# Patient Record
Sex: Male | Born: 2000 | Race: Black or African American | Hispanic: No | Marital: Single | Smoking: Never smoker
Health system: Southern US, Community
[De-identification: ages and names within clinical notes are randomized; demographics above are authoritative.]

---
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
## 2018-10-05 DIAGNOSIS — L700 Acne vulgaris: Secondary | ICD-10-CM | POA: Diagnosis not present

## 2018-10-05 DIAGNOSIS — D485 Neoplasm of uncertain behavior of skin: Secondary | ICD-10-CM | POA: Diagnosis not present

## 2018-10-10 DIAGNOSIS — R51 Headache: Secondary | ICD-10-CM | POA: Diagnosis not present

## 2019-02-16 DIAGNOSIS — L700 Acne vulgaris: Secondary | ICD-10-CM | POA: Diagnosis not present

## 2019-02-16 DIAGNOSIS — Z1331 Encounter for screening for depression: Secondary | ICD-10-CM | POA: Diagnosis not present

## 2019-04-15 DIAGNOSIS — M61152 Myositis ossificans progressiva, left thigh: Secondary | ICD-10-CM | POA: Diagnosis not present

## 2019-04-20 DIAGNOSIS — L700 Acne vulgaris: Secondary | ICD-10-CM | POA: Diagnosis not present

## 2019-04-21 DIAGNOSIS — M25662 Stiffness of left knee, not elsewhere classified: Secondary | ICD-10-CM | POA: Diagnosis not present

## 2019-04-21 DIAGNOSIS — S76112D Strain of left quadriceps muscle, fascia and tendon, subsequent encounter: Secondary | ICD-10-CM | POA: Diagnosis not present

## 2019-04-21 DIAGNOSIS — M6281 Muscle weakness (generalized): Secondary | ICD-10-CM | POA: Diagnosis not present

## 2019-04-23 DIAGNOSIS — M6281 Muscle weakness (generalized): Secondary | ICD-10-CM | POA: Diagnosis not present

## 2019-04-23 DIAGNOSIS — S76112D Strain of left quadriceps muscle, fascia and tendon, subsequent encounter: Secondary | ICD-10-CM | POA: Diagnosis not present

## 2019-04-23 DIAGNOSIS — M25662 Stiffness of left knee, not elsewhere classified: Secondary | ICD-10-CM | POA: Diagnosis not present

## 2019-05-12 DIAGNOSIS — M61152 Myositis ossificans progressiva, left thigh: Secondary | ICD-10-CM | POA: Diagnosis not present

## 2019-08-13 ENCOUNTER — Other Ambulatory Visit: Payer: Self-pay

## 2019-08-13 DIAGNOSIS — Z20822 Contact with and (suspected) exposure to covid-19: Secondary | ICD-10-CM

## 2019-08-14 LAB — NOVEL CORONAVIRUS, NAA: SARS-CoV-2, NAA: NOT DETECTED

## 2019-08-17 DIAGNOSIS — J020 Streptococcal pharyngitis: Secondary | ICD-10-CM | POA: Diagnosis not present

## 2019-08-17 DIAGNOSIS — J029 Acute pharyngitis, unspecified: Secondary | ICD-10-CM | POA: Diagnosis not present

## 2019-09-14 DIAGNOSIS — U071 COVID-19: Secondary | ICD-10-CM | POA: Diagnosis not present

## 2019-09-16 ENCOUNTER — Other Ambulatory Visit: Payer: Self-pay

## 2019-09-16 ENCOUNTER — Encounter (HOSPITAL_COMMUNITY): Payer: Self-pay

## 2019-09-16 ENCOUNTER — Ambulatory Visit (HOSPITAL_COMMUNITY)
Admission: EM | Admit: 2019-09-16 | Discharge: 2019-09-16 | Disposition: A | Discharge status: Home / Self Care | Payer: BC Managed Care – PPO | Attending: Family Medicine | Admitting: Family Medicine

## 2019-09-16 DIAGNOSIS — Z880 Allergy status to penicillin: Secondary | ICD-10-CM | POA: Diagnosis not present

## 2019-09-16 DIAGNOSIS — R519 Headache, unspecified: Secondary | ICD-10-CM

## 2019-09-16 DIAGNOSIS — U071 COVID-19: Secondary | ICD-10-CM | POA: Insufficient documentation

## 2019-09-16 NOTE — ED Triage Notes (Signed)
Patient presents to Urgent Care with complaints of requesting a covid test since he had a headache 3 days ago. Patient reports he has no symptoms at this time, no known exposures.

## 2019-09-16 NOTE — Discharge Instructions (Addendum)
If your Covid-19 test is positive, you will receive a phone call from [facility] regarding your results. Negative test results are not called. Both positive and negative results area always visible on MyChart. If you do not have a MyChart account, sign up instructions are in your discharge papers.  

## 2019-09-18 LAB — NOVEL CORONAVIRUS, NAA (HOSP ORDER, SEND-OUT TO REF LAB; TAT 18-24 HRS): SARS-CoV-2, NAA: DETECTED — AB

## 2019-09-20 ENCOUNTER — Telehealth: Payer: Self-pay | Admitting: Emergency Medicine

## 2019-09-20 NOTE — Telephone Encounter (Signed)

## 2019-09-21 ENCOUNTER — Telehealth (HOSPITAL_COMMUNITY): Payer: Self-pay | Admitting: Emergency Medicine

## 2019-09-21 NOTE — Telephone Encounter (Signed)
Attempted to reach patient x2. No answer at this time. Voicemail left.   Letter sent.   

## 2019-09-21 NOTE — ED Provider Notes (Signed)
  [facility]   [id number] 09/16/19 Arrival Time: 4010  ASSESSMENT & PLAN:  1. Acute nonintractable headache, unspecified headache type     COVID-19 testing sent. To self-quarantine until results are available. Normal neurologically. OTC symptom care.   Reviewed expectations re: course of current medical issues. Questions answered. Outlined signs and symptoms indicating need for more acute intervention. Patient verbalized understanding. After Visit Summary given.   SUBJECTIVE: History from: patient. Damon Porter is a 18 y.o. male who requests COVID-19 testing. Known COVID-19 contact: none known. Recent travel: none. Denies: runny nose, congestion, fever, cough, sore throat and difficulty breathing. Does reports a generalized headache over the past few days. Afebrile. Normal PO intake without n/v/d.  ROS: As per HPI.   OBJECTIVE:  Vitals:   09/16/19 1806  BP: 129/72  Pulse: 74  Resp: 15  Temp: 98.7 F (37.1 C)  TempSrc: Oral  SpO2: 100%    General appearance: alert; no distress Eyes: PERRLA; EOMI; conjunctiva normal HENT: [place]; AT; nasal mucosa normal; oral mucosa normal Neck: supple  Lungs: speaks full sentences without difficulty; unlabored Heart: regular rate and rhythm Abdomen: soft, non-tender Extremities: no edema Skin: warm and dry Neurologic: normal gait; CN 2-12 grossly intact; moves all extremities normally Psychological: alert and cooperative; normal mood and affect    Allergies  Allergen Reactions  . Penicillins Hives     Social History   Socioeconomic History  . Marital status: Single    Spouse name: Not on file  . Number of children: Not on file  . Years of education: Not on file  . Highest education level: Not on file  Occupational History  . Not on file  Tobacco Use  . Smoking status: Never Smoker  . Smokeless tobacco: Never Used  Substance and Sexual Activity  . Alcohol use: Not Currently  . Drug use: Not Currently    . Sexual activity: Not on file  Other Topics Concern  . Not on file  Social History Narrative  . Not on file   Social Determinants of Health   Financial Resource Strain:   . Difficulty of Paying Living Expenses: Not on file  Food Insecurity:   . Worried About Charity fundraiser in the Last Year: Not on file  . Ran Out of Food in the Last Year: Not on file  Transportation Needs:   . Lack of Transportation (Medical): Not on file  . Lack of Transportation (Non-Medical): Not on file  Physical Activity:   . Days of Exercise per Week: Not on file  . Minutes of Exercise per Session: Not on file  Stress:   . Feeling of Stress : Not on file  Social Connections:   . Frequency of Communication with Friends and Family: Not on file  . Frequency of Social Gatherings with Friends and Family: Not on file  . Attends Religious Services: Not on file  . Active Member of Clubs or Organizations: Not on file  . Attends Archivist Meetings: Not on file  . Marital Status: Not on file  Intimate Partner Violence:   . Fear of Current or Ex-Partner: Not on file  . Emotionally Abused: Not on file  . Physically Abused: Not on file  . Sexually Abused: Not on file   Family History  Problem Relation Age of Onset  . Healthy Mother   . Healthy Father    History reviewed. No pertinent surgical history.   Vanessa Kick, MD 09/21/19 [phone]

## 2019-09-24 ENCOUNTER — Ambulatory Visit: Payer: BC Managed Care – PPO | Attending: Internal Medicine

## 2019-09-24 DIAGNOSIS — Z20822 Contact with and (suspected) exposure to covid-19: Secondary | ICD-10-CM

## 2019-09-24 DIAGNOSIS — Z20828 Contact with and (suspected) exposure to other viral communicable diseases: Secondary | ICD-10-CM | POA: Diagnosis not present

## 2019-09-25 LAB — NOVEL CORONAVIRUS, NAA: SARS-CoV-2, NAA: NOT DETECTED

## 2019-09-28 ENCOUNTER — Other Ambulatory Visit: Payer: BC Managed Care – PPO

## 2019-10-19 DIAGNOSIS — L700 Acne vulgaris: Secondary | ICD-10-CM | POA: Diagnosis not present

## 2019-10-22 DIAGNOSIS — Z79899 Other long term (current) drug therapy: Secondary | ICD-10-CM | POA: Diagnosis not present

## 2019-10-22 DIAGNOSIS — L700 Acne vulgaris: Secondary | ICD-10-CM | POA: Diagnosis not present

## 2019-12-21 DIAGNOSIS — L700 Acne vulgaris: Secondary | ICD-10-CM | POA: Diagnosis not present

## 2020-01-19 DIAGNOSIS — Z20828 Contact with and (suspected) exposure to other viral communicable diseases: Secondary | ICD-10-CM | POA: Diagnosis not present

## 2020-07-19 ENCOUNTER — Encounter (HOSPITAL_BASED_OUTPATIENT_CLINIC_OR_DEPARTMENT_OTHER): Payer: Self-pay | Admitting: *Deleted

## 2020-07-19 ENCOUNTER — Emergency Department (HOSPITAL_BASED_OUTPATIENT_CLINIC_OR_DEPARTMENT_OTHER)
Admission: EM | Admit: 2020-07-19 | Discharge: 2020-07-19 | Disposition: A | Discharge status: Home / Self Care | Payer: BC Managed Care – PPO | Attending: Emergency Medicine | Admitting: Emergency Medicine

## 2020-07-19 ENCOUNTER — Other Ambulatory Visit: Payer: Self-pay

## 2020-07-19 DIAGNOSIS — B349 Viral infection, unspecified: Secondary | ICD-10-CM | POA: Insufficient documentation

## 2020-07-19 DIAGNOSIS — R509 Fever, unspecified: Secondary | ICD-10-CM | POA: Diagnosis not present

## 2020-07-19 DIAGNOSIS — R000 Tachycardia, unspecified: Secondary | ICD-10-CM | POA: Diagnosis not present

## 2020-07-19 DIAGNOSIS — Z20822 Contact with and (suspected) exposure to covid-19: Secondary | ICD-10-CM | POA: Diagnosis not present

## 2020-07-19 LAB — GROUP A STREP BY PCR: Group A Strep by PCR: NOT DETECTED

## 2020-07-19 LAB — URINALYSIS, ROUTINE W REFLEX MICROSCOPIC
Bilirubin Urine: NEGATIVE
Glucose, UA: NEGATIVE mg/dL
Hgb urine dipstick: NEGATIVE
Ketones, ur: NEGATIVE mg/dL
Leukocytes,Ua: NEGATIVE
Nitrite: NEGATIVE
Protein, ur: NEGATIVE mg/dL
Specific Gravity, Urine: 1.015 (ref 1.005–1.030)
pH: 8 (ref 5.0–8.0)

## 2020-07-19 LAB — RESPIRATORY PANEL BY RT PCR (FLU A&B, COVID)
Influenza A by PCR: NEGATIVE
Influenza B by PCR: NEGATIVE
SARS Coronavirus 2 by RT PCR: NEGATIVE

## 2020-07-19 MED ORDER — IBUPROFEN 800 MG PO TABS
ORAL_TABLET | ORAL | Status: AC
  Administered 2020-07-19: 800 mg via ORAL
  Filled 2020-07-19: qty 1

## 2020-07-19 MED ORDER — IBUPROFEN 800 MG PO TABS: 800.0000 mg | ORAL_TABLET | Freq: Once | ORAL | Status: AC

## 2020-07-19 MED ORDER — ONDANSETRON 4 MG PO TBDP
4.0000 mg | ORAL_TABLET | Freq: Once | ORAL | Status: AC
  Administered 2020-07-19: 4 mg via ORAL
  Filled 2020-07-19: qty 1

## 2020-07-19 MED ORDER — ACETAMINOPHEN 325 MG PO TABS
650.0000 mg | ORAL_TABLET | Freq: Once | ORAL | Status: AC
  Administered 2020-07-19: 650 mg via ORAL
  Filled 2020-07-19: qty 2

## 2020-07-19 NOTE — ED Triage Notes (Signed)
C/o fever chills body aches , n/v/d x 1 day

## 2020-07-19 NOTE — Discharge Instructions (Signed)
Continue to drink plenty of fluids and take Tylenol for the fever.  Make sure you are getting plenty of rest and you are not to return to work until you are fever free for 48 hours.

## 2020-07-19 NOTE — ED Notes (Signed)
  Patient ambulated around the nurses station with no complications.  Patient states he had no chest pain or SOB.  Only complaints of pain are a headache 4/10.  SPO2 99% during ambulation.

## 2020-07-19 NOTE — ED Provider Notes (Signed)
[facility] EMERGENCY DEPARTMENT Provider Note   CSN: [id number] Arrival date & time: 07/19/20  1740     History Chief Complaint  Patient presents with  . covid symptoms    Damon Porter is a 19 y.o. male.  Patient is a 19 year old healthy male with no known medical problems presenting today with 12 hours of fever, nausea, diarrhea, sore throat, body aches and headache.  This started this morning at around 4 AM.  Patient reports that he was working at his job all day and finally was feeling so bad he he could not continue working.  He went and got a rapid Covid test but it was negative.  Patient has had no known Covid exposures but also has not received the vaccine.  Patient reports that he is also been having flank pain and some dysuria today as well.  He has not had any penile discharge.  He is sexually active but reports he use a condom every time his only had one partner for months.  He has not taken any medication and reports because of the nausea and diarrhea he has not had anything to eat today.  The history is provided by the patient.       History reviewed. No pertinent past medical history.  There are no problems to display for this patient.   History reviewed. No pertinent surgical history.     Family History  Problem Relation Age of Onset  . Healthy Mother   . Healthy Father     Social History   Tobacco Use  . Smoking status: Never Smoker  . Smokeless tobacco: Never Used  Vaping Use  . Vaping Use: Never used  Substance Use Topics  . Alcohol use: Not Currently  . Drug use: Not Currently    Home Medications Prior to Admission medications   Not on File    Allergies    Penicillins  Review of Systems   Review of Systems  All other systems reviewed and are negative.   Physical Exam Updated Vital Signs BP (!) 94/56   Pulse (!) 123   Temp (!) 103.4 F (39.7 C)   Resp 18   Ht 6\' 4"  (1.93 m)   Wt 68 kg   SpO2 100%   BMI 18.26  kg/m   Physical Exam Vitals and nursing note reviewed.  Constitutional:      General: He is not in acute distress.    Appearance: Normal appearance. He is well-developed and normal weight.  HENT:     Head: Normocephalic and atraumatic.     Right Ear: Tympanic membrane normal.     Left Ear: Tympanic membrane normal.     Mouth/Throat:     Mouth: Mucous membranes are dry.     Comments: Mild erythema of bilateral tonsils but no exudate present Eyes:     Conjunctiva/sclera: Conjunctivae normal.     Pupils: Pupils are equal, round, and reactive to light.  Neck:     Comments: No cervical adenopathy Cardiovascular:     Rate and Rhythm: Regular rhythm. Tachycardia present.     Pulses: Normal pulses.     Heart sounds: No murmur heard.   Pulmonary:     Effort: Pulmonary effort is normal. No respiratory distress.     Breath sounds: Normal breath sounds. No wheezing or rales.  Abdominal:     General: There is no distension.     Palpations: Abdomen is soft.     Tenderness: There is no  abdominal tenderness. There is no right CVA tenderness, left CVA tenderness, guarding or rebound.  Musculoskeletal:        General: No tenderness. Normal range of motion.     Cervical back: Normal range of motion and neck supple.     Right lower leg: No edema.     Left lower leg: No edema.  Lymphadenopathy:     Cervical: No cervical adenopathy.  Skin:    General: Skin is warm and dry.     Findings: No erythema or rash.  Neurological:     General: No focal deficit present.     Mental Status: He is alert and oriented to person, place, and time. Mental status is at baseline.  Psychiatric:        Mood and Affect: Mood normal.        Behavior: Behavior normal.        Thought Content: Thought content normal.     ED Results / Procedures / Treatments   Labs (all labs ordered are listed, but only abnormal results are displayed) Labs Reviewed  RESPIRATORY PANEL BY RT PCR (FLU A&B, COVID)  GROUP A STREP  BY PCR  URINALYSIS, ROUTINE W REFLEX MICROSCOPIC    EKG None  Radiology No results found.  Procedures Procedures (including critical care time)  Medications Ordered in ED Medications  ondansetron (ZOFRAN-ODT) disintegrating tablet 4 mg (has no administration in time range)  acetaminophen (TYLENOL) tablet 650 mg (650 mg Oral Given 07/19/20 1755)    ED Course  I have reviewed the triage vital signs and the nursing notes.  Pertinent labs & imaging results that were available during my care of the patient were reviewed by me and considered in my medical decision making (see chart for details).    MDM Rules/Calculators/A&P                          Pt with symptoms consistent with viral URI vs COVID.  Well appearing here.  No signs of breathing difficulty  No signs of pharyngitis, otitis or abnormal abdominal findings.  Mild bilateral erythema of tonsils and strep is pending.  Patient is complaining of dysuria will check a urine.  He was given Zofran and will attempt a p.o. challenge.  Patient was tachycardic, febrile and had a borderline blood pressure upon arrival here of 94/56.  Will recheck vitals after he has received Tylenol and drink some fluid.  He is otherwise well-appearing.  COVID test is pending.  6:55 PM COVID and flu are neg.  UA and strep are pending.  Pt has benign abd at this time.  9:40 PM All labs are wnl.  On repeat evaluation patient's temperature has improved and his blood pressure and heart rate are also improved.  He got up and walked around and denies any chest pain or shortness of breath.  Breath sounds are clear.  Suspect viral illness.  Patient's mom is present and discussed findings with her.  Patient is tolerating p.o.'s.  Will discharge home to quarantine, rest, fluids and antipyretics.  Patient given return precautions.  MDM Number of Diagnoses or Management Options   Amount and/or Complexity of Data Reviewed Clinical lab tests: ordered and  reviewed Independent visualization of images, tracings, or specimens: yes  Risk of Complications, Morbidity, and/or Mortality Presenting problems: moderate Diagnostic procedures: minimal Management options: minimal  Patient Progress Patient progress: improved    Final Clinical Impression(s) / ED Diagnoses Final diagnoses:  Acute viral  syndrome    Rx / DC Orders ED Discharge Orders    None       Gwyneth Sprout, MD 07/19/20 2141

## 2020-07-19 NOTE — ED Notes (Signed)
Pt tolerating po fluids. No emesis. Fever decreased. Pt states they feel better.

## 2020-09-14 DIAGNOSIS — Z23 Encounter for immunization: Secondary | ICD-10-CM | POA: Diagnosis not present

## 2021-04-05 DIAGNOSIS — Z20822 Contact with and (suspected) exposure to covid-19: Secondary | ICD-10-CM | POA: Diagnosis not present

## 2021-04-05 DIAGNOSIS — U071 COVID-19: Secondary | ICD-10-CM | POA: Diagnosis not present

## 2021-10-08 DIAGNOSIS — U071 COVID-19: Secondary | ICD-10-CM | POA: Diagnosis not present

## 2021-10-08 DIAGNOSIS — Z20828 Contact with and (suspected) exposure to other viral communicable diseases: Secondary | ICD-10-CM | POA: Diagnosis not present

## 2021-10-08 DIAGNOSIS — Z03818 Encounter for observation for suspected exposure to other biological agents ruled out: Secondary | ICD-10-CM | POA: Diagnosis not present

## 2021-10-09 DIAGNOSIS — J029 Acute pharyngitis, unspecified: Secondary | ICD-10-CM | POA: Diagnosis not present

## 2021-10-09 DIAGNOSIS — Z20822 Contact with and (suspected) exposure to covid-19: Secondary | ICD-10-CM | POA: Diagnosis not present

## 2021-10-09 DIAGNOSIS — J069 Acute upper respiratory infection, unspecified: Secondary | ICD-10-CM | POA: Diagnosis not present

## 2024-05-14 ENCOUNTER — Ambulatory Visit
Admission: EM | Admit: 2024-05-14 | Discharge: 2024-05-14 | Disposition: A | Discharge status: Home / Self Care | Attending: Family Medicine | Admitting: Family Medicine

## 2024-05-14 ENCOUNTER — Emergency Department (HOSPITAL_BASED_OUTPATIENT_CLINIC_OR_DEPARTMENT_OTHER)
Admission: EM | Admit: 2024-05-14 | Discharge: 2024-05-14 | Disposition: A | Discharge status: Home / Self Care | Attending: Emergency Medicine | Admitting: Emergency Medicine

## 2024-05-14 ENCOUNTER — Encounter (HOSPITAL_BASED_OUTPATIENT_CLINIC_OR_DEPARTMENT_OTHER): Payer: Self-pay | Admitting: Emergency Medicine

## 2024-05-14 ENCOUNTER — Other Ambulatory Visit: Payer: Self-pay

## 2024-05-14 DIAGNOSIS — D72829 Elevated white blood cell count, unspecified: Secondary | ICD-10-CM | POA: Diagnosis not present

## 2024-05-14 DIAGNOSIS — R112 Nausea with vomiting, unspecified: Secondary | ICD-10-CM

## 2024-05-14 DIAGNOSIS — K529 Noninfective gastroenteritis and colitis, unspecified: Secondary | ICD-10-CM

## 2024-05-14 LAB — COMPREHENSIVE METABOLIC PANEL WITH GFR
ALT: 36 U/L (ref 0–44)
AST: 43 U/L — ABNORMAL HIGH (ref 15–41)
Albumin: 5.1 g/dL — ABNORMAL HIGH (ref 3.5–5.0)
Alkaline Phosphatase: 106 U/L (ref 38–126)
Anion gap: 19 — ABNORMAL HIGH (ref 5–15)
BUN: 17 mg/dL (ref 6–20)
CO2: 23 mmol/L (ref 22–32)
Calcium: 10.2 mg/dL (ref 8.9–10.3)
Chloride: 100 mmol/L (ref 98–111)
Creatinine, Ser: 0.98 mg/dL (ref 0.61–1.24)
GFR, Estimated: >60 mL/min (ref >60)
Glucose, Bld: 82 mg/dL (ref 70–99)
Potassium: 5.1 mmol/L (ref 3.5–5.1)
Sodium: 141 mmol/L (ref 135–145)
Total Bilirubin: 1.5 mg/dL — ABNORMAL HIGH (ref 0.0–1.2)
Total Protein: 8.1 g/dL (ref 6.5–8.1)

## 2024-05-14 LAB — LIPASE, BLOOD: Lipase: 11 U/L (ref 11–51)

## 2024-05-14 LAB — CBC
HCT: 46.2 % (ref 39.0–52.0)
Hemoglobin: 15.5 g/dL (ref 13.0–17.0)
MCH: 31.1 pg (ref 26.0–34.0)
MCHC: 33.5 g/dL (ref 30.0–36.0)
MCV: 92.8 fL (ref 80.0–100.0)
Platelets: 293 K/uL (ref 150–400)
RBC: 4.98 MIL/uL (ref 4.22–5.81)
RDW: 12 % (ref 11.5–15.5)
WBC: 14.6 K/uL — ABNORMAL HIGH (ref 4.0–10.5)
nRBC: 0 % (ref 0.0–0.2)

## 2024-05-14 LAB — POC SOFIA SARS ANTIGEN FIA: SARS Coronavirus 2 Ag: NEGATIVE

## 2024-05-14 MED ORDER — ONDANSETRON HCL 4 MG/2ML IJ SOLN
4.0000 mg | Freq: Once | INTRAMUSCULAR | Status: AC
  Administered 2024-05-14: 4 mg via INTRAMUSCULAR

## 2024-05-14 MED ORDER — METOCLOPRAMIDE HCL 5 MG/ML IJ SOLN
10.0000 mg | Freq: Once | INTRAMUSCULAR | Status: AC
  Administered 2024-05-14: 10 mg via INTRAVENOUS
  Filled 2024-05-14: qty 2

## 2024-05-14 MED ORDER — SODIUM CHLORIDE 0.9 % IV BOLUS
1000.0000 mL | Freq: Once | INTRAVENOUS | Status: AC
  Administered 2024-05-14: 1000 mL via INTRAVENOUS

## 2024-05-14 MED ORDER — DIPHENHYDRAMINE HCL 50 MG/ML IJ SOLN
25.0000 mg | Freq: Once | INTRAMUSCULAR | Status: AC
  Administered 2024-05-14: 25 mg via INTRAVENOUS
  Filled 2024-05-14: qty 1

## 2024-05-14 MED ORDER — ONDANSETRON 4 MG PO TBDP: 4.0000 mg | ORAL_TABLET | Freq: Three times a day (TID) | ORAL | 0 refills | Status: AC | PRN

## 2024-05-14 NOTE — ED Provider Notes (Signed)
 [facility] EMERGENCY DEPARTMENT AT [facility] Provider Note   CSN: [id number] Arrival date & time: 05/14/24  1453     Patient presents with: Emesis   Damon Porter is a 23 y.o. male.   23 yo M with a chief complaints of nausea and vomiting.  He thinks this is due to going out drinking with his relatives last night.  Had felt bad last night and then had multiple episodes of emesis primarily this morning.  He went to urgent care and was given an IM dose of Zofran  and since then has not had any vomiting but still feels a bit nauseated.  Decided to come to the ED because he felt he would benefit from IV fluids.  He denies abdominal pain but said it did hurt a little bit when he had an exam done at urgent care.  Denies diarrhea.   Emesis      Prior to Admission medications   Medication Sig Start Date End Date Taking? Authorizing Provider  ondansetron  (ZOFRAN -ODT) 4 MG disintegrating tablet Take 1 tablet (4 mg total) by mouth every 8 (eight) hours as needed for nausea or vomiting. 05/14/24   Mayer, Jodi R, NP    Allergies: Penicillins    Review of Systems  Gastrointestinal:  Positive for vomiting.    Updated Vital Signs BP 132/70   Pulse (!) 106   Temp 98.2 F (36.8 C) (Oral)   Resp 18   Ht 6' 4 (1.93 m)   SpO2 99%   BMI 18.26 kg/m   Physical Exam Vitals and nursing note reviewed.  Constitutional:      Appearance: He is well-developed.  HENT:     Head: Normocephalic and atraumatic.  Eyes:     Pupils: Pupils are equal, round, and reactive to light.  Neck:     Vascular: No JVD.  Cardiovascular:     Rate and Rhythm: Normal rate and regular rhythm.     Heart sounds: No murmur heard.    No friction rub. No gallop.  Pulmonary:     Effort: No respiratory distress.     Breath sounds: No wheezing.  Abdominal:     General: There is no distension.     Tenderness: There is no abdominal tenderness. There is no guarding or rebound.     Comments: Benign abdominal  exam  Musculoskeletal:        General: Normal range of motion.     Cervical back: Normal range of motion and neck supple.  Skin:    Coloration: Skin is not pale.     Findings: No rash.  Neurological:     Mental Status: He is alert and oriented to person, place, and time.  Psychiatric:        Behavior: Behavior normal.     (all labs ordered are listed, but only abnormal results are displayed) Labs Reviewed  COMPREHENSIVE METABOLIC PANEL WITH GFR - Abnormal; Notable for the following components:      Result Value   Albumin 5.1 (*)    AST 43 (*)    Total Bilirubin 1.5 (*)    Anion gap 19 (*)    All other components within normal limits  CBC - Abnormal; Notable for the following components:   WBC 14.6 (*)    All other components within normal limits  LIPASE, BLOOD  URINALYSIS, ROUTINE W REFLEX MICROSCOPIC    EKG: None  Radiology: No results found.   Procedures   Medications Ordered in the  ED  sodium chloride  0.9 % bolus 1,000 mL (1,000 mLs Intravenous New Bag/Given 05/14/24 1544)  metoCLOPramide  (REGLAN ) injection 10 mg (10 mg Intravenous Given 05/14/24 1545)  diphenhydrAMINE  (BENADRYL ) injection 25 mg (25 mg Intravenous Given 05/14/24 1544)                                    Medical Decision Making Amount and/or Complexity of Data Reviewed Labs: ordered.  Risk Prescription drug management.   23 yo M with a chief complaints of nausea and vomiting after going out drinking with his relatives last night.  He is well-appearing and nontoxic.  His benign abdominal exam for me.  Will obtain blood work treat symptoms IV fluids reassess.  Patient feeling much better on repeat assessment.  Mild leukocytosis, total bilirubin mildly elevated as well as anion gap I think likely secondary to dehydration.  No hypoglycemia not on medicine for diabetes.  Able to tolerate by mouth here without issue.  LFTs and lipase unremarkable.  Will discharge home.  PCP follow-up.  4:45 PM:  I  have discussed the diagnosis/risks/treatment options with the patient.  Evaluation and diagnostic testing in the emergency department does not suggest an emergent condition requiring admission or immediate intervention beyond what has been performed at this time.  They will follow up with PCP. We also discussed returning to the ED immediately if new or worsening sx occur. We discussed the sx which are most concerning (e.g., sudden worsening pain, fever, inability to tolerate by mouth) that necessitate immediate return. Medications administered to the patient during their visit and any new prescriptions provided to the patient are listed below.  Medications given during this visit Medications  sodium chloride  0.9 % bolus 1,000 mL (1,000 mLs Intravenous New Bag/Given 05/14/24 1544)  metoCLOPramide  (REGLAN ) injection 10 mg (10 mg Intravenous Given 05/14/24 1545)  diphenhydrAMINE  (BENADRYL ) injection 25 mg (25 mg Intravenous Given 05/14/24 1544)     The patient appears reasonably screen and/or stabilized for discharge and I doubt any other medical condition or other [facility] requiring further screening, evaluation, or treatment in the ED at this time prior to discharge.       Final diagnoses:  Nausea and vomiting in adult    ED Discharge Orders     None          Emil Share, DO 05/14/24 1645

## 2024-05-14 NOTE — ED Triage Notes (Signed)
 Pt from UC- reports emesis since last night. Unable to tolerate po intake. Got IM zofran  at UC.

## 2024-05-14 NOTE — Discharge Instructions (Signed)
 Try the brat diet, please follow-up with your family doctor in the office.  You can take the nausea medicine for nausea.  Please return for sudden worsening abdominal pain fever or inability to eat or drink.

## 2024-05-14 NOTE — Discharge Instructions (Signed)
 You were given section of antinausea medication while in the clinic.  I have sent a prescription for this medication to your pharmacy that you may take every 8 hours as needed for nausea or vomiting.  Try to hydrate with water, Gatorade, Powerade, Pedialyte.  Bland diet such as bananas, rice, applesauce, toast and advance as you tolerate.  Please go to emergency room if you are unable to stay hydrated, you develop fevers you are unable to manage, you have severe abdominal pain, or any new concerns that arise.  I hope you feel better soon!

## 2024-05-14 NOTE — ED Triage Notes (Addendum)
 Pt c/o drank ETOH 2 nights ago and has been vomiting since and HA. Pt states can't keep anything and keeps vomiting every hour on the hour.

## 2024-05-14 NOTE — ED Provider Notes (Addendum)
 [facility]    CSN: [id number] Arrival date & time: 05/14/24  1249      History   Chief Complaint No chief complaint on file.   HPI Damon Porter is a 23 y.o. male with no significant past medical history Patient reports last night he had 3 cups of whiskey presents for nausea vomiting.  For a birthday celebration.  States he does not typically drink alcohol.  Reports sometime after that he began having nonbilious nonbloody vomiting that worsened this morning.  He states since 8 AM today he has thrown up pretty much hourly.  He states he has not been able to keep any food or fluids down since late last night.  He does state he did urinate around 11 AM today and it was a normal amount.  He denies any fevers, URI symptoms/sore throat, diarrhea.  No abdominal pain.  No history of GI diagnoses such as Crohn's, IBS, colitis, diverticulitis.  No sick contacts or recent travel.  Attempted to take some Tums without improvement.  No other concerns at this time  HPI  History reviewed. No pertinent past medical history.  There are no active problems to display for this patient.   History reviewed. No pertinent surgical history.     Home Medications    Prior to Admission medications   Medication Sig Start Date End Date Taking? Authorizing Provider  ondansetron  (ZOFRAN -ODT) 4 MG disintegrating tablet Take 1 tablet (4 mg total) by mouth every 8 (eight) hours as needed for nausea or vomiting. 05/14/24  Yes Loreda Myla SAUNDERS, NP    Family History Family History  Problem Relation Age of Onset   Healthy Mother    Healthy Father     Social History Social History   Tobacco Use   Smoking status: Never   Smokeless tobacco: Never  Vaping Use   Vaping status: Never Used  Substance Use Topics   Alcohol use: Yes    Comment: occ   Drug use: Never     Allergies   Penicillins   Review of Systems Review of Systems  Gastrointestinal:  Positive for nausea and vomiting.      Physical Exam Triage Vital Signs ED Triage Vitals  Encounter Vitals Group     BP 05/14/24 1345 125/75     Girls Systolic BP Percentile --      Girls Diastolic BP Percentile --      Boys Systolic BP Percentile --      Boys Diastolic BP Percentile --      Pulse Rate 05/14/24 1345 88     Resp 05/14/24 1345 16     Temp 05/14/24 1345 98.6 F (37 C)     Temp Source 05/14/24 1345 Oral     SpO2 05/14/24 1345 99 %     Weight --      Height --      Head Circumference --      Peak Flow --      Pain Score 05/14/24 1344 4     Pain Loc --      Pain Education --      Exclude from Growth Chart --    No data found.  Updated Vital Signs BP 125/75   Pulse 88   Temp 98.6 F (37 C) (Oral)   Resp 16   SpO2 99%   Visual Acuity Right Eye Distance:   Left Eye Distance:   Bilateral Distance:    Right Eye Near:   Left Eye  Near:    Bilateral Near:     Physical Exam Vitals and nursing note reviewed.  Constitutional:      General: He is not in acute distress.    Appearance: Normal appearance. He is not ill-appearing.  HENT:     Head: Normocephalic and atraumatic.  Eyes:     Pupils: Pupils are equal, round, and reactive to light.  Cardiovascular:     Rate and Rhythm: Normal rate.  Pulmonary:     Effort: Pulmonary effort is normal.  Abdominal:     General: Abdomen is flat.     Palpations: Abdomen is soft. There is no shifting dullness or fluid wave.     Tenderness: There is no abdominal tenderness. There is no guarding or rebound. Negative signs include Rovsing's sign and McBurney's sign.  Skin:    General: Skin is warm and dry.  Neurological:     General: No focal deficit present.     Mental Status: He is alert and oriented to person, place, and time.  Psychiatric:        Behavior: Behavior normal.      UC Treatments / Results  Labs (all labs ordered are listed, but only abnormal results are displayed) Labs Reviewed  POC SOFIA SARS ANTIGEN FIA     EKG   Radiology No results found.  Procedures Procedures (including critical care time)  Medications Ordered in UC Medications  ondansetron  (ZOFRAN ) injection 4 mg (4 mg Intramuscular Given 05/14/24 1355)    Initial Impression / Assessment and Plan / UC Course  I have reviewed the triage vital signs and the nursing notes.  Pertinent labs & imaging results that were available during my care of the patient were reviewed by me and considered in my medical decision making (see chart for details).     Reviewed exam and symptoms with patient.  No red flags.  Negative rapid COVID.  Patient reports significant improvement of symptoms after IM Zofran .  He was able to keep water down while in the clinic.  Discussed gastroenteritis and continued symptomatic treatment.  Rx Zofran  sent to pharmacy.  Discussed hydration/electrolyte replacement and bland diet.  He was instructed to go to the ER if he is unable to maintain his hydration or develops any worsening symptoms, red flags reviewed.  Patient verbalized understanding of instructions and all questions answered Final Clinical Impressions(s) / UC Diagnoses   Final diagnoses:  Nausea and vomiting, unspecified vomiting type  Gastroenteritis     Discharge Instructions      You were given section of antinausea medication while in the clinic.  I have sent a prescription for this medication to your pharmacy that you may take every 8 hours as needed for nausea or vomiting.  Try to hydrate with water, Gatorade, Powerade, Pedialyte.  Bland diet such as bananas, rice, applesauce, toast and advance as you tolerate.  Please go to emergency room if you are unable to stay hydrated, you develop fevers you are unable to manage, you have severe abdominal pain, or any new concerns that arise.  I hope you feel better soon!     ED Prescriptions     Medication Sig Dispense Auth. Provider   ondansetron  (ZOFRAN -ODT) 4 MG disintegrating tablet Take 1  tablet (4 mg total) by mouth every 8 (eight) hours as needed for nausea or vomiting. 10 tablet Darran Gabay, Jodi R, NP      PDMP not reviewed this encounter.   Loreda Myla SAUNDERS, NP 05/14/24 1435  Loreda Myla SAUNDERS, NP 05/14/24 [phone]
